# Patient Record
Sex: Male | Born: 1990 | Race: Black or African American | Hispanic: No | Marital: Single | Smoking: Never smoker
Health system: Southern US, Community
[De-identification: ages and names within clinical notes are randomized; demographics above are authoritative.]

## PROBLEM LIST (undated history)

## (undated) DIAGNOSIS — J302 Other seasonal allergic rhinitis: Secondary | ICD-10-CM

## (undated) HISTORY — PX: HERNIA REPAIR: SHX51

---

## 2018-08-12 ENCOUNTER — Emergency Department (HOSPITAL_COMMUNITY)
Admission: EM | Admit: 2018-08-12 | Discharge: 2018-08-12 | Disposition: A | Discharge status: Home / Self Care | Payer: Self-pay | Attending: Emergency Medicine | Admitting: Emergency Medicine

## 2018-08-12 ENCOUNTER — Emergency Department (HOSPITAL_COMMUNITY): Payer: Self-pay

## 2018-08-12 ENCOUNTER — Encounter (HOSPITAL_COMMUNITY): Payer: Self-pay

## 2018-08-12 ENCOUNTER — Other Ambulatory Visit: Payer: Self-pay

## 2018-08-12 DIAGNOSIS — R0789 Other chest pain: Secondary | ICD-10-CM | POA: Insufficient documentation

## 2018-08-12 DIAGNOSIS — R1012 Left upper quadrant pain: Secondary | ICD-10-CM | POA: Insufficient documentation

## 2018-08-12 HISTORY — DX: Other seasonal allergic rhinitis: J30.2

## 2018-08-12 LAB — COMPREHENSIVE METABOLIC PANEL
ALT: 40 U/L (ref 0–44)
AST: 31 U/L (ref 15–41)
Albumin: 4.6 g/dL (ref 3.5–5.0)
Alkaline Phosphatase: 51 U/L (ref 38–126)
Anion gap: 10 (ref 5–15)
BUN: 15 mg/dL (ref 6–20)
CO2: 29 mmol/L (ref 22–32)
Calcium: 9.5 mg/dL (ref 8.9–10.3)
Chloride: 100 mmol/L (ref 98–111)
Creatinine, Ser: 1.47 mg/dL — ABNORMAL HIGH (ref 0.61–1.24)
GFR calc Af Amer: >60 mL/min (ref >60)
GFR calc non Af Amer: >60 mL/min (ref >60)
Glucose, Bld: 101 mg/dL — ABNORMAL HIGH (ref 70–99)
Potassium: 4.4 mmol/L (ref 3.5–5.1)
Sodium: 139 mmol/L (ref 135–145)
Total Bilirubin: 0.6 mg/dL (ref 0.3–1.2)
Total Protein: 8.3 g/dL — ABNORMAL HIGH (ref 6.5–8.1)

## 2018-08-12 LAB — CBC
HCT: 46.4 % (ref 39.0–52.0)
Hemoglobin: 15 g/dL (ref 13.0–17.0)
MCH: 32.2 pg (ref 26.0–34.0)
MCHC: 32.3 g/dL (ref 30.0–36.0)
MCV: 99.6 fL (ref 80.0–100.0)
Platelets: 172 10*3/uL (ref 150–400)
RBC: 4.66 MIL/uL (ref 4.22–5.81)
RDW: 12.3 % (ref 11.5–15.5)
WBC: 7.1 10*3/uL (ref 4.0–10.5)
nRBC: 0 % (ref 0.0–0.2)

## 2018-08-12 LAB — TROPONIN I (HIGH SENSITIVITY): Troponin I (High Sensitivity): 3 ng/L (ref <18)

## 2018-08-12 LAB — LIPASE, BLOOD: Lipase: 26 U/L (ref 11–51)

## 2018-08-12 MED ORDER — LIDOCAINE VISCOUS HCL 2 % MT SOLN
15.0000 mL | Freq: Once | OROMUCOSAL | Status: AC
  Administered 2018-08-12: 10:00:00 15 mL via ORAL
  Filled 2018-08-12: qty 15

## 2018-08-12 MED ORDER — ALUM & MAG HYDROXIDE-SIMETH 200-200-20 MG/5ML PO SUSP
30.0000 mL | Freq: Once | ORAL | Status: AC
  Administered 2018-08-12: 30 mL via ORAL
  Filled 2018-08-12: qty 30

## 2018-08-12 MED ORDER — SODIUM CHLORIDE 0.9% FLUSH
3.0000 mL | Freq: Once | INTRAVENOUS | Status: AC
  Administered 2018-08-12: 3 mL via INTRAVENOUS

## 2018-08-12 MED ORDER — OMEPRAZOLE 20 MG PO CPDR: 20.0000 mg | DELAYED_RELEASE_CAPSULE | Freq: Every day | ORAL | 0 refills | Status: DC

## 2018-08-12 MED ORDER — SODIUM CHLORIDE 0.9 % IV BOLUS
1000.0000 mL | Freq: Once | INTRAVENOUS | Status: AC
  Administered 2018-08-12: 1000 mL via INTRAVENOUS

## 2018-08-12 MED ORDER — SUCRALFATE 1 G PO TABS: 1.0000 g | ORAL_TABLET | Freq: Three times a day (TID) | ORAL | 0 refills | Status: DC

## 2018-08-12 NOTE — ED Triage Notes (Signed)
Patient c/o mid upper chest pain and LUQ pain x 1 week. Patient denies any SOB or diaphoresis.

## 2018-08-12 NOTE — ED Notes (Signed)
Patient given discharge teaching and verbalized understanding. Patient ambulated out of ED with a steady gait. 

## 2018-08-12 NOTE — Discharge Instructions (Addendum)
Please follow the instructions about dietary changes.  Please make sure you are eating your food slowly.  I have given you the information for GI doctor incase your symptoms fail to improve in 1-2 weeks despite making these changes.   I have given you 2 prescriptions today.  The most important is Prilosec.  This is also available over-the-counter, please check dosages to make sure that it is the same.  If your insurance will pay for it you can attempt to get it is a prescription also however it normally still is less expensive over-the-counter.  I have also given you a prescription for Carafate.  This is a prescription only medication and can help coat your stomach to decrease irritation.    If your pain or symptoms change or become concerning to you please seek additional medical care and evaluation.  I have given you the information of a local primary care doctor who will see patients without insurance.  I would recommend you call them, or another doctor and make an appointment.

## 2018-08-12 NOTE — ED Provider Notes (Signed)
[facility]-EMERGENCY DEPT Provider Note   CSN: [id number] Arrival date & time: 08/12/18  [id number]    History   Chief Complaint Chief Complaint  Patient presents with  . Chest Pain  . Abdominal Pain    HPI Steven Weber is a 28 y.o. male with no significant past medical history presents today for evaluation of midline anterior chest pain and left upper quadrant abdominal pain for approximately 1 week.  He reports that his pain is intermittent, and he has noticed that it happens more frequently after eating.  He has not noticed any other aggravating or alleviating factors.  His pain was unchanged with Gas-X.  He does report that when the pain is present it typically last 3 to 4 hours and when he burps it helps slightly.  He describes his pain as feeling like a burp is trapped that he cannot get out.  He does note that he eats his food very fast and wonders if that may be contributing.  He denies any shortness of breath or sick contacts.  He denies any leg swelling, hemoptysis, hormone use, recent surgeries or immobilizations.  No history of PE/DVT.  He denies any known cardiac history.  He denies any recent trauma.  He does not smoke or use tobacco.  He states that in the past week he has had 1 bottle of wine however denies any other alcohol.  He reports that the wind did not change his abdominal pain.   No known coronavirus exposures.     HPI  Past Medical History:  Diagnosis Date  . Seasonal allergies     There are no active problems to display for this patient.   Past Surgical History:  Procedure Laterality Date  . HERNIA REPAIR     As a infant        Home Medications    Prior to Admission medications   Medication Sig Start Date End Date Taking? Authorizing Provider  Simethicone (GAS-X PO) Take 1 tablet by mouth as needed (gas).   Yes [provider]  omeprazole (PRILOSEC) 20 MG capsule Take 1 capsule (20 mg total) by mouth daily for 28  days. 08/12/18 09/09/18  Cristina GongHammond, Quashawn Jewkes W, PA-C  sucralfate (CARAFATE) 1 g tablet Take 1 tablet (1 g total) by mouth 4 (four) times daily -  with meals and at bedtime for 14 days. 08/12/18 08/26/18  Cristina GongHammond, Riyanshi Wahab W, PA-C    Family History Family History  Problem Relation Age of Onset  . Healthy Mother   . Healthy Father     Social History Social History   Tobacco Use  . Smoking status: Never Smoker  . Smokeless tobacco: Never Used  Substance Use Topics  . Alcohol use: Yes    Alcohol/week: 2.0 - 6.0 standard drinks    Types: 2 - 6 Cans of beer per week  . Drug use: Never     Allergies   Patient has no known allergies.   Review of Systems Review of Systems  Constitutional: Negative for chills, diaphoresis and fever.  Respiratory: Negative for chest tightness and shortness of breath.   Cardiovascular: Positive for chest pain. Negative for palpitations and leg swelling.  Gastrointestinal: Positive for abdominal pain. Negative for diarrhea, nausea and vomiting.  Musculoskeletal: Negative for back pain and neck pain.  Skin: Negative for color change and wound.  Neurological: Negative for weakness, numbness and headaches.  Psychiatric/Behavioral: Negative for confusion.  All other systems reviewed and are negative.  Physical Exam Updated Vital Signs BP 128/73   Pulse 77   Temp 98.8 F (37.1 C) (Oral)   Resp (!) 21   Ht 5\' 6"  (1.676 m)   Wt 110.7 kg   SpO2 100%   BMI 39.38 kg/m   Physical Exam Vitals signs and nursing note reviewed.  Constitutional:      General: He is not in acute distress.    Appearance: He is well-developed. He is not diaphoretic.  HENT:     Head: Normocephalic and atraumatic.  Eyes:     General: No scleral icterus.       Right eye: No discharge.        Left eye: No discharge.     Conjunctiva/sclera: Conjunctivae normal.  Neck:     Musculoskeletal: Normal range of motion and neck supple.  Cardiovascular:     Rate and Rhythm:  Normal rate and regular rhythm.     Heart sounds: Normal heart sounds.  Pulmonary:     Effort: Pulmonary effort is normal. No respiratory distress.     Breath sounds: Normal breath sounds. No stridor. No decreased breath sounds.  Chest:     Chest wall: No mass, deformity, tenderness or edema.  Abdominal:     General: Bowel sounds are normal. There is no distension.     Palpations: Abdomen is soft. There is no mass.     Tenderness: There is no abdominal tenderness. There is no guarding.  Musculoskeletal:        General: No deformity.     Right lower leg: He exhibits no tenderness. No edema.     Left lower leg: He exhibits no tenderness. No edema.  Skin:    General: Skin is warm and dry.  Neurological:     General: No focal deficit present.     Mental Status: He is alert.     Motor: No abnormal muscle tone.  Psychiatric:        Mood and Affect: Mood normal.        Behavior: Behavior normal.      ED Treatments / Results  Labs (all labs ordered are listed, but only abnormal results are displayed) Labs Reviewed  COMPREHENSIVE METABOLIC PANEL - Abnormal; Notable for the following components:      Result Value   Glucose, Bld 101 (*)    Creatinine, Ser 1.47 (*)    Total Protein 8.3 (*)    All other components within normal limits  CBC  LIPASE, BLOOD  TROPONIN I (HIGH SENSITIVITY)  TROPONIN I (HIGH SENSITIVITY)    EKG EKG Interpretation  Date/Time:  Sunday August 12 2018 07:57:08 EDT Ventricular Rate:  78 PR Interval:    QRS Duration: 72 QT Interval:  348 QTC Calculation: 397 R Axis:   42 Text Interpretation:  Sinus arrhythmia No old tracing to compare Confirmed by Dorie Rank [phone]) on 08/12/2018 8:00:01 AM   Radiology Dg Chest 2 View  Result Date: 08/12/2018 CLINICAL DATA:  28 year old male with history of chest pain and left upper quadrant abdominal pain for 1 week. EXAM: CHEST - 2 VIEW COMPARISON:  No priors. FINDINGS: Lung volumes are normal. No consolidative  airspace disease. No pleural effusions. No pneumothorax. No pulmonary nodule or mass noted. Pulmonary vasculature and the cardiomediastinal silhouette are within normal limits. IMPRESSION: No radiographic evidence of acute cardiopulmonary disease. Electronically Signed   By: Vinnie Langton M.D.   On: 08/12/2018 08:58    Procedures Procedures (including critical care time)  Medications Ordered  in ED Medications  sodium chloride flush (NS) 0.9 % injection 3 mL (3 mLs Intravenous Given 08/12/18 0832)  sodium chloride 0.9 % bolus 1,000 mL (0 mLs Intravenous Stopped 08/12/18 1038)  alum & mag hydroxide-simeth (MAALOX/MYLANTA) 200-200-20 MG/5ML suspension 30 mL (30 mLs Oral Given 08/12/18 0941)    And  lidocaine (XYLOCAINE) 2 % viscous mouth solution 15 mL (15 mLs Oral Given 08/12/18 0942)     Initial Impression / Assessment and Plan / ED Course  I have reviewed the triage vital signs and the nursing notes.  Pertinent labs & imaging results that were available during my care of the patient were reviewed by me and considered in my medical decision making (see chart for details).  Clinical Course as of Aug 12 1422  Sun Aug 12, 2018  1033 Patient reevaluated, he reports moderate relief with GI cocktail of Maalox and lidocaine.  Requesting discharge at this time.  Repeat abd exam benign.    [EH]    Clinical Course User Index [EH] Cristina GongHammond, Renne Cornick W, PA-C      Patient presents today for evaluation of midline anterior chest pain and left upper quadrant abdominal pain for approximately 1 week.  He is hemodynamically stable.  PERC negative.  Chest x-ray obtained without evidence of acute abnormalities.  EKG without evidence of ischemia.  His troponin is not elevated, his creatinine is very slightly high at 1.47 without old to compare however he does not have other significant hematologic or electrolyte derangements.  Lipase is not elevated.  I suspect that his creatinine is slightly high as he got  up this morning and came directly here and therefore has not had anything to drink since last night.  He is given 1 L IV fluids in the emergency room.  Recommended recheck with PCP in 1 to 2 weeks.  He was treated with GI cocktail after which he had moderate improvement in his symptoms.  Heart score is 0, do not suspect ACS.  Both initial and repeat abdominal exams remained benign without tenderness to palpation.  Suspect GERD versus esophagitis/gastritis.  Recommended Prilosec, and Carafate along with dietary changes.  He is given information for GI follow-up with instructions that if his symptoms fail to improve despite 2 weeks of treatment he should schedule an appointment for additional evaluation.  Return precautions were discussed with patient who states their understanding.  At the time of discharge patient denied any unaddressed complaints or concerns.  Patient is agreeable for discharge home.  Final Clinical Impressions(s) / ED Diagnoses   Final diagnoses:  Atypical chest pain  Left upper quadrant pain    ED Discharge Orders         Ordered    omeprazole (PRILOSEC) 20 MG capsule  Daily     08/12/18 1039    sucralfate (CARAFATE) 1 g tablet  3 times daily with meals & bedtime     08/12/18 1039           Cristina GongHammond, Shatima Zalar W, [place] JerseyPA-C 08/12/18 1424    Linwood DibblesKnapp, Jon, MD 08/13/18 1248

## 2018-08-12 NOTE — ED Notes (Signed)
Pt made aware of urinalysis. Patient stated they are unable to provide a sample at this time.

## 2019-10-15 DIAGNOSIS — R079 Chest pain, unspecified: Secondary | ICD-10-CM | POA: Insufficient documentation

## 2019-10-16 ENCOUNTER — Emergency Department (HOSPITAL_COMMUNITY): Payer: BC Managed Care – PPO

## 2019-10-16 ENCOUNTER — Emergency Department (HOSPITAL_COMMUNITY)
Admission: EM | Admit: 2019-10-16 | Discharge: 2019-10-16 | Disposition: A | Discharge status: Home / Self Care | Payer: BC Managed Care – PPO | Attending: Emergency Medicine | Admitting: Emergency Medicine

## 2019-10-16 ENCOUNTER — Other Ambulatory Visit: Payer: Self-pay

## 2019-10-16 ENCOUNTER — Encounter (HOSPITAL_COMMUNITY): Payer: Self-pay | Admitting: Emergency Medicine

## 2019-10-16 DIAGNOSIS — R079 Chest pain, unspecified: Secondary | ICD-10-CM

## 2019-10-16 LAB — BASIC METABOLIC PANEL
Anion gap: 10 (ref 5–15)
BUN: 13 mg/dL (ref 6–20)
CO2: 26 mmol/L (ref 22–32)
Calcium: 9.9 mg/dL (ref 8.9–10.3)
Chloride: 101 mmol/L (ref 98–111)
Creatinine, Ser: 1.33 mg/dL — ABNORMAL HIGH (ref 0.61–1.24)
GFR calc Af Amer: >60 mL/min (ref >60)
GFR calc non Af Amer: >60 mL/min (ref >60)
Glucose, Bld: 100 mg/dL — ABNORMAL HIGH (ref 70–99)
Potassium: 3.7 mmol/L (ref 3.5–5.1)
Sodium: 137 mmol/L (ref 135–145)

## 2019-10-16 LAB — CBC
HCT: 44.1 % (ref 39.0–52.0)
Hemoglobin: 14.7 g/dL (ref 13.0–17.0)
MCH: 31.3 pg (ref 26.0–34.0)
MCHC: 33.3 g/dL (ref 30.0–36.0)
MCV: 94 fL (ref 80.0–100.0)
Platelets: 185 10*3/uL (ref 150–400)
RBC: 4.69 MIL/uL (ref 4.22–5.81)
RDW: 12 % (ref 11.5–15.5)
WBC: 8.7 10*3/uL (ref 4.0–10.5)
nRBC: 0 % (ref 0.0–0.2)

## 2019-10-16 LAB — TROPONIN I (HIGH SENSITIVITY)
Troponin I (High Sensitivity): 3 ng/L (ref <18)
Troponin I (High Sensitivity): 2 ng/L (ref <18)

## 2019-10-16 MED ORDER — LIDOCAINE VISCOUS HCL 2 % MT SOLN
15.0000 mL | Freq: Once | OROMUCOSAL | Status: AC
  Administered 2019-10-16: 15 mL via ORAL
  Filled 2019-10-16: qty 15

## 2019-10-16 MED ORDER — ALUM & MAG HYDROXIDE-SIMETH 200-200-20 MG/5ML PO SUSP
30.0000 mL | Freq: Once | ORAL | Status: AC
  Administered 2019-10-16: 30 mL via ORAL
  Filled 2019-10-16: qty 30

## 2019-10-16 MED ORDER — SUCRALFATE 1 G PO TABS: 1.0000 g | ORAL_TABLET | Freq: Three times a day (TID) | ORAL | 0 refills | Status: AC

## 2019-10-16 MED ORDER — OMEPRAZOLE 20 MG PO CPDR: 20.0000 mg | DELAYED_RELEASE_CAPSULE | Freq: Every day | ORAL | 0 refills | Status: AC

## 2019-10-16 NOTE — ED Triage Notes (Signed)
Patient is complaining of right side chest pain that has been going on for several weeks. Patient states he is not in pain now but feels like heart burn.

## 2019-10-16 NOTE — ED Provider Notes (Signed)
[facility]-EMERGENCY DEPT Provider Note   CSN: [id number] Arrival date & time: 10/15/19  2334     History Chief Complaint  Patient presents with   Chest Pain    Steven Weber is a 29 y.o. male.  Patient presents to the emergency department with a chief complaint of chest pain.  He reports having had the symptoms for the past couple of weeks.  He denies any medical problems.  Denies any early family history of heart disease.  He does not smoke.  He denies any history of PE or DVT.  He states that the pain feels like heartburn.  He denies treatments prior to arrival.  Denies any other associated symptoms.  The history is provided by the patient. No language interpreter was used.       Past Medical History:  Diagnosis Date   Seasonal allergies     There are no problems to display for this patient.   Past Surgical History:  Procedure Laterality Date   HERNIA REPAIR     As a infant       Family History  Problem Relation Age of Onset   Healthy Mother    Healthy Father     Social History   Tobacco Use   Smoking status: Never Smoker   Smokeless tobacco: Never Used  Vaping Use   Vaping Use: Never used  Substance Use Topics   Alcohol use: Yes    Alcohol/week: 2.0 - 6.0 standard drinks    Types: 2 - 6 Cans of beer per week   Drug use: Never    Home Medications Prior to Admission medications   Medication Sig Start Date End Date Taking? Authorizing Provider  omeprazole (PRILOSEC) 20 MG capsule Take 1 capsule (20 mg total) by mouth daily. 10/16/19   Roxy Horseman, PA-C  Simethicone (GAS-X PO) Take 1 tablet by mouth as needed (gas).    [provider]  sucralfate (CARAFATE) 1 g tablet Take 1 tablet (1 g total) by mouth 4 (four) times daily -  with meals and at bedtime. 10/16/19   Roxy Horseman, PA-C    Allergies    Patient has no known allergies.  Review of Systems   Review of Systems  All other systems reviewed  and are negative.   Physical Exam Updated Vital Signs BP 109/70    Pulse 69    Temp 98.6 F (37 C) (Oral)    Resp (!) 22    Ht 5\' 6"  (1.676 m)    Wt 113.9 kg    SpO2 97%    BMI 40.51 kg/m   Physical Exam Vitals and nursing note reviewed.  Constitutional:      Appearance: He is well-developed.  HENT:     Head: Normocephalic and atraumatic.  Eyes:     Conjunctiva/sclera: Conjunctivae normal.  Cardiovascular:     Rate and Rhythm: Normal rate and regular rhythm.     Heart sounds: No murmur heard.   Pulmonary:     Effort: Pulmonary effort is normal. No respiratory distress.     Breath sounds: Normal breath sounds.  Abdominal:     Palpations: Abdomen is soft.     Tenderness: There is no abdominal tenderness.  Musculoskeletal:     Cervical back: Neck supple.  Skin:    General: Skin is warm and dry.  Neurological:     Mental Status: He is alert and oriented to person, place, and time.  Psychiatric:  Mood and Affect: Mood normal.        Behavior: Behavior normal.     ED Results / Procedures / Treatments   Labs (all labs ordered are listed, but only abnormal results are displayed) Labs Reviewed  BASIC METABOLIC PANEL - Abnormal; Notable for the following components:      Result Value   Glucose, Bld 100 (*)    Creatinine, Ser 1.33 (*)    All other components within normal limits  CBC  TROPONIN I (HIGH SENSITIVITY)  TROPONIN I (HIGH SENSITIVITY)    EKG None  Radiology DG Chest 2 View  Result Date: 10/16/2019 CLINICAL DATA:  Mid chest pain for 2 weeks EXAM: CHEST - 2 VIEW COMPARISON:  Radiograph 08/12/2018 FINDINGS: Hazy and streaky opacities towards the lung bases with shallow inspiration likely reflect atelectatic changes. Stable biapical pleuroparenchymal thickening. No consolidation, features of edema, pneumothorax, or effusion. The cardiomediastinal contours are unremarkable. No acute osseous or soft tissue abnormality. IMPRESSION: Basilar atelectatic changes,  no other acute cardiopulmonary abnormality. Electronically Signed   By: Kreg Shropshire M.D.   On: 10/16/2019 00:23    Procedures Procedures (including critical care time)  Medications Ordered in ED Medications  alum & mag hydroxide-simeth (MAALOX/MYLANTA) 200-200-20 MG/5ML suspension 30 mL (has no administration in time range)    And  lidocaine (XYLOCAINE) 2 % viscous mouth solution 15 mL (has no administration in time range)    ED Course  I have reviewed the triage vital signs and the nursing notes.  Pertinent labs & imaging results that were available during my care of the patient were reviewed by me and considered in my medical decision making (see chart for details).    MDM Rules/Calculators/A&P                          Patient with chest pain that has been ongoing for the past couple weeks.  He is afebrile.  He is not tachycardic nor hypoxic, doubt PE.  He is PERC negative.  Chest x-ray shows no obvious abnormality.  He is low risk for ACS based on lack of risk factors and age.  His symptoms feel like heartburn, I suspect this is the source, and will prescribe Carafate and omeprazole.  Patient understands and agrees with the plan.  He will follow up with his regular doctor or return for new or worsening symptoms. Final Clinical Impression(s) / ED Diagnoses Final diagnoses:  Nonspecific chest pain    Rx / DC Orders ED Discharge Orders         Ordered    sucralfate (CARAFATE) 1 g tablet  3 times daily with meals & bedtime        10/16/19 0527    omeprazole (PRILOSEC) 20 MG capsule  Daily        10/16/19 0527           Roxy Horseman, PA-C 10/16/19 0530    Dione Booze, MD 10/16/19 [phone]

## 2019-10-16 NOTE — ED Notes (Signed)
Save blue in main lab 

## 2022-04-03 IMAGING — CR DG CHEST 2V
2 series · 2 of 2 positions shown · non-contrast
Comparison: Radiograph 08/12/2018

CLINICAL DATA: Mid chest pain for 2 weeks

EXAM:
CHEST - 2 VIEW

[w chest pa]
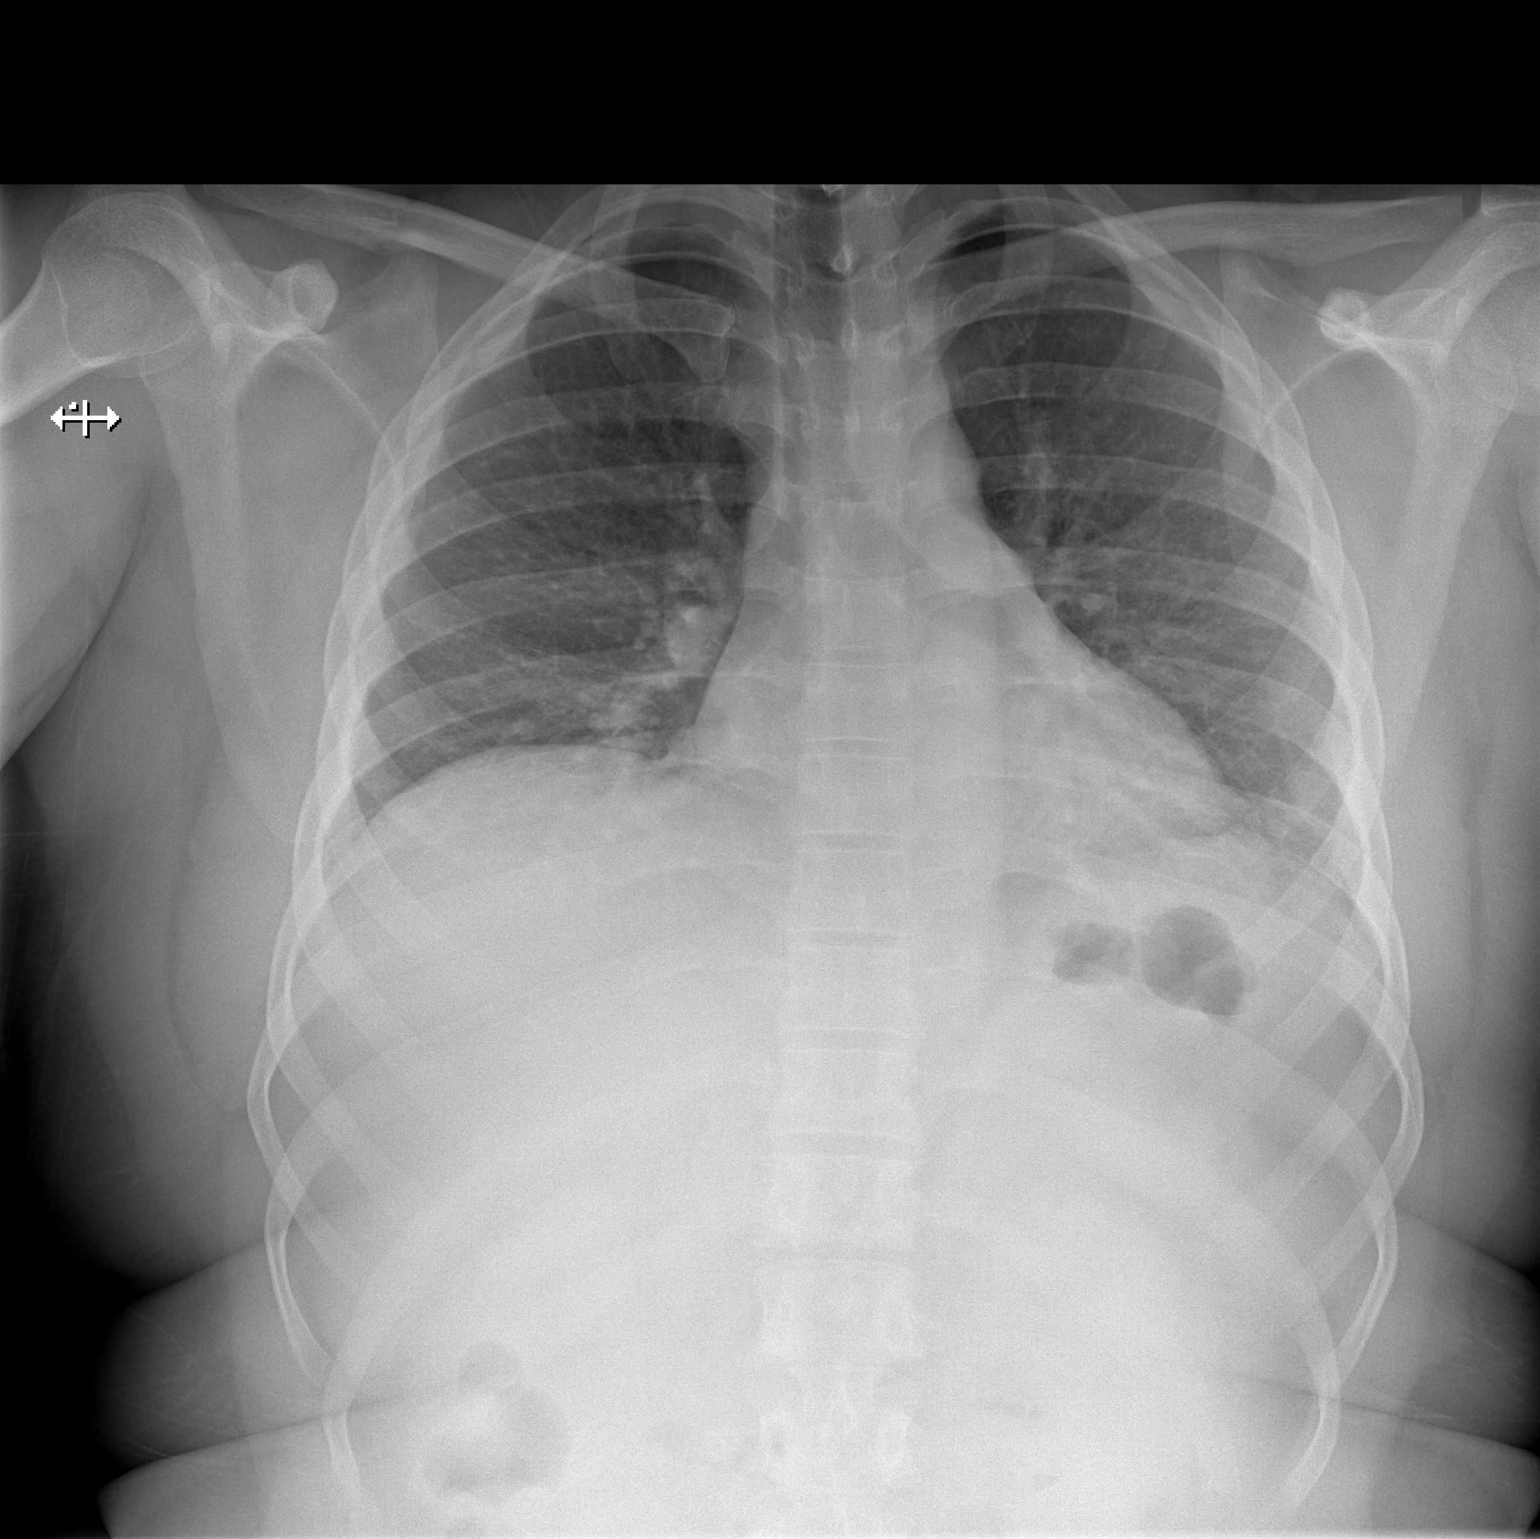

[w chest lat]
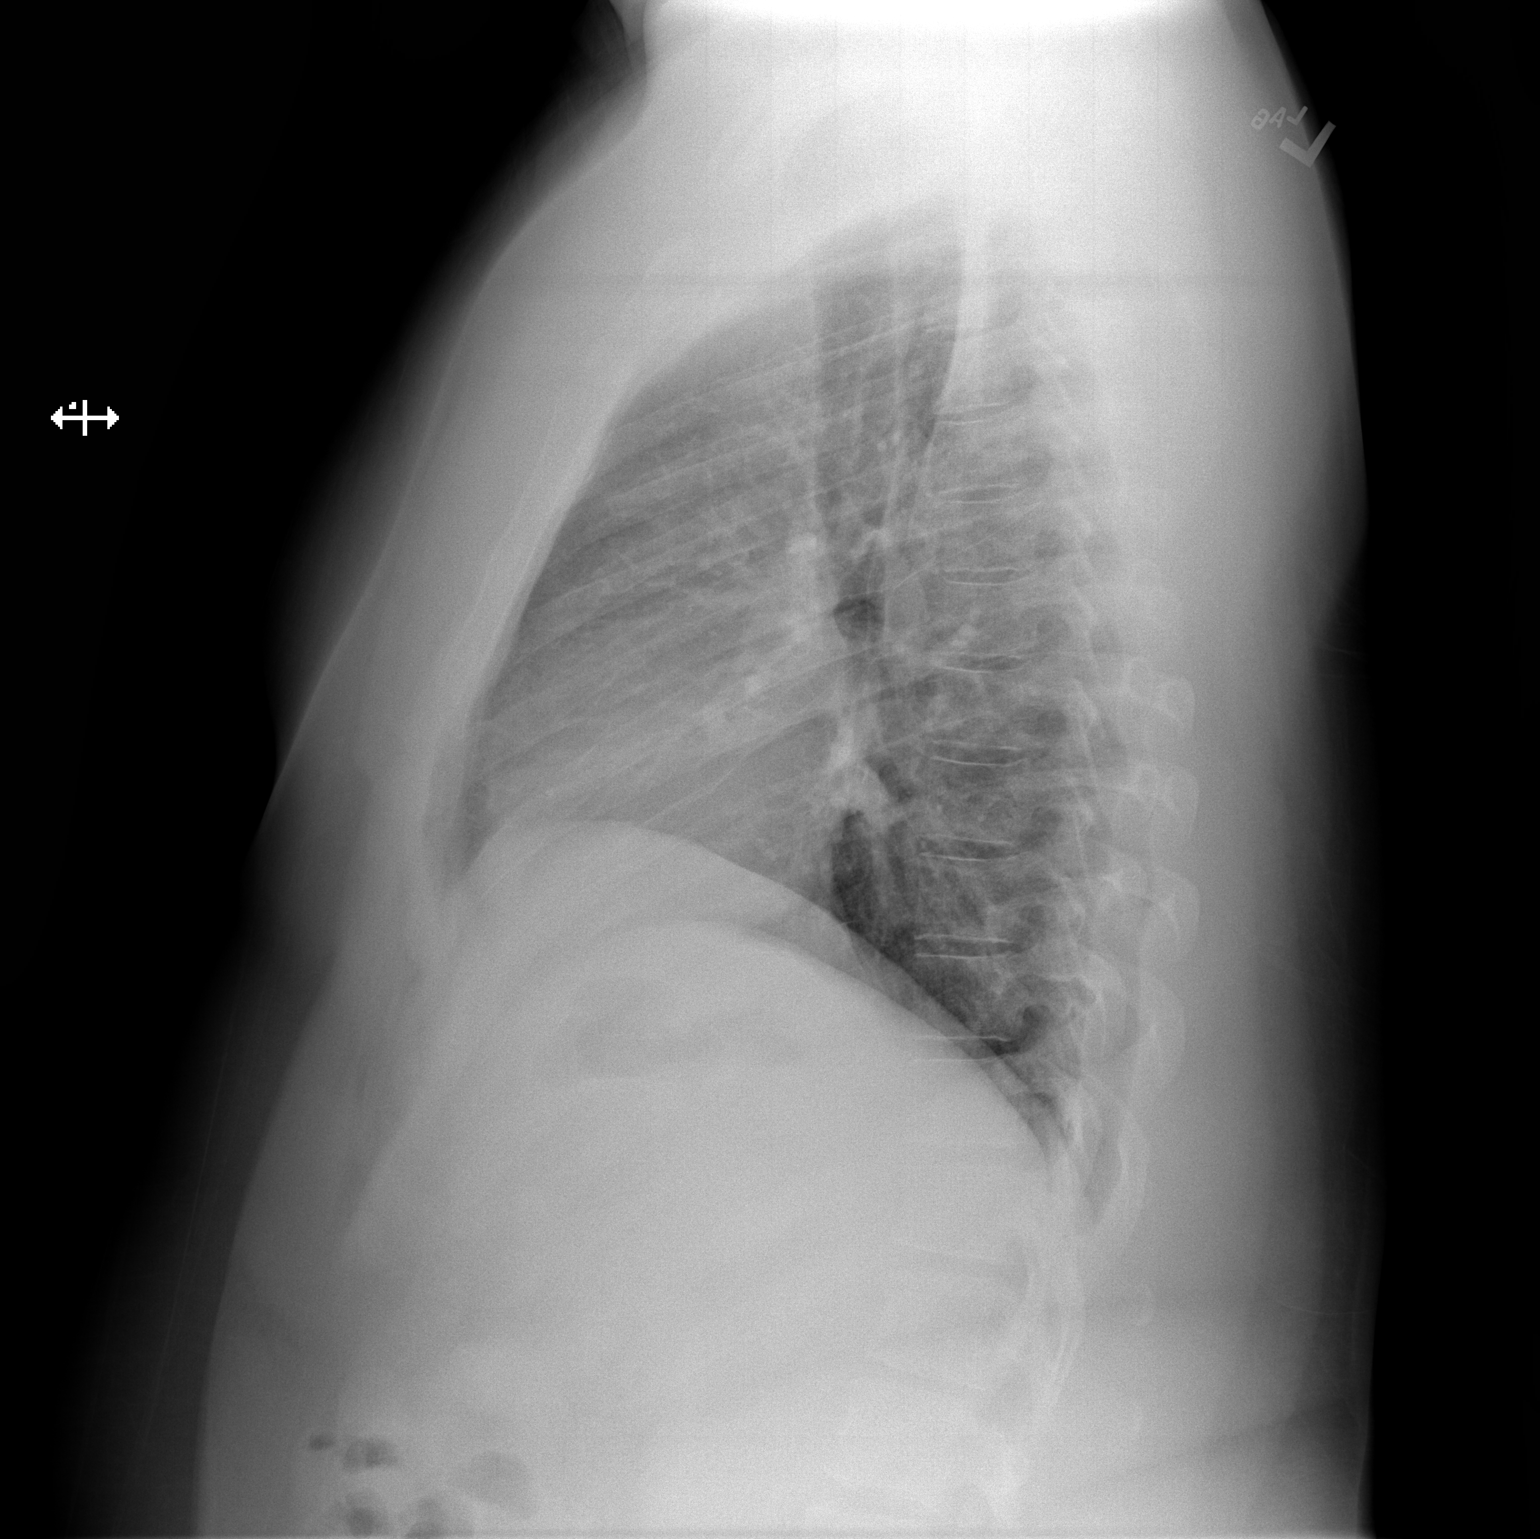

[2 of 2 positions shown; findings below may reference images not displayed]

FINDINGS: Hazy and streaky opacities towards the lung bases with shallow
inspiration likely reflect atelectatic changes. Stable biapical
pleuroparenchymal thickening. No consolidation, features of edema,
pneumothorax, or effusion. The cardiomediastinal contours are
unremarkable. No acute osseous or soft tissue abnormality.
IMPRESSION: Basilar atelectatic changes, no other acute cardiopulmonary
abnormality.

## 2022-07-30 ENCOUNTER — Ambulatory Visit
Admission: RE | Admit: 2022-07-30 | Discharge: 2022-07-30 | Disposition: A | Discharge status: Home / Self Care | Payer: BC Managed Care – PPO | Source: Ambulatory Visit | Attending: Internal Medicine | Admitting: Internal Medicine

## 2022-07-30 VITALS — BP 105/67 | HR 77 | Temp 98.1°F | Resp 16

## 2022-07-30 DIAGNOSIS — Z9109 Other allergy status, other than to drugs and biological substances: Secondary | ICD-10-CM | POA: Diagnosis not present

## 2022-07-30 MED ORDER — CETIRIZINE HCL 10 MG PO TABS: 10.0000 mg | ORAL_TABLET | Freq: Every day | ORAL | 0 refills | Status: AC

## 2022-07-30 MED ORDER — FLUTICASONE PROPIONATE 50 MCG/ACT NA SUSP: 1.0000 | Freq: Every day | NASAL | 0 refills | Status: AC

## 2022-07-30 NOTE — Discharge Instructions (Signed)
Start Flonase and Zyrtec daily.  Lots of rest and fluids.  Please follow-up with your PCP if your symptoms do not improve.  Please go to the emergency room for any worsening symptoms.  Hope you feel better soon!

## 2022-07-30 NOTE — ED Provider Notes (Signed)
[facility]    CSN: [id number] Arrival date & time: 07/30/22  1914      History   Chief Complaint Chief Complaint  Patient presents with   Sore Throat    Respiratory inflammation - Entered by patient    HPI Steven Weber is a 32 y.o. male  presents for evaluation of URI symptoms for 1 days. Patient reports associated symptoms of an intermittent congestion with cough and sometimes throat irritation. Denies N/V/D, sore throat, fevers, chills, ear pain, body aches, shortness of breath. Patient does not have a hx of asthma or smoking.  Pt has taken nothing OTC for symptoms. Pt has no other concerns at this time.    Sore Throat    Past Medical History:  Diagnosis Date   Seasonal allergies     There are no problems to display for this patient.   Past Surgical History:  Procedure Laterality Date   HERNIA REPAIR     As a infant       Home Medications    Prior to Admission medications   Medication Sig Start Date End Date Taking? Authorizing Provider  atorvastatin (LIPITOR) 40 MG tablet Take 40 mg by mouth daily.   Yes [provider]  cetirizine (ZYRTEC ALLERGY) 10 MG tablet Take 1 tablet (10 mg total) by mouth daily for 14 days. 07/30/22 08/13/22 Yes Radford Pax, NP  cholecalciferol (VITAMIN D3) 25 MCG (1000 UNIT) tablet Take 1,000 Units by mouth daily.   Yes [provider]  fluticasone (FLONASE) 50 MCG/ACT nasal spray Place 1 spray into both nostrils daily. 07/30/22  Yes Radford Pax, NP  omeprazole (PRILOSEC) 20 MG capsule Take 1 capsule (20 mg total) by mouth daily. 10/16/19   Roxy Horseman, PA-C  Simethicone (GAS-X PO) Take 1 tablet by mouth as needed (gas).    [provider]  sucralfate (CARAFATE) 1 g tablet Take 1 tablet (1 g total) by mouth 4 (four) times daily -  with meals and at bedtime. 10/16/19   Roxy Horseman, PA-C    Family History Family History  Problem Relation Age of Onset   Healthy Mother    Healthy  Father     Social History Social History   Tobacco Use   Smoking status: Never   Smokeless tobacco: Never  Vaping Use   Vaping status: Never Used  Substance Use Topics   Alcohol use: Yes    Alcohol/week: 2.0 - 6.0 standard drinks of alcohol    Types: 2 - 6 Cans of beer per week    Comment: once a week   Drug use: Never     Allergies   Patient has no known allergies.   Review of Systems Review of Systems  HENT:  Positive for congestion.        Throat irritation     Physical Exam Triage Vital Signs ED Triage Vitals  Encounter Vitals Group     BP 07/30/22 0912 105/67     Systolic BP Percentile --      Diastolic BP Percentile --      Pulse Rate 07/30/22 0912 77     Resp 07/30/22 0912 16     Temp 07/30/22 0912 98.1 F (36.7 C)     Temp Source 07/30/22 0912 Oral     SpO2 07/30/22 0912 94 %     Weight --      Height --      Head Circumference --      Peak  Flow --      Pain Score 07/30/22 0914 0     Pain Loc --      Pain Education --      Exclude from Growth Chart --    No data found.  Updated Vital Signs BP 105/67 (BP Location: Right Arm)   Pulse 77   Temp 98.1 F (36.7 C) (Oral)   Resp 16   SpO2 94%   Visual Acuity Right Eye Distance:   Left Eye Distance:   Bilateral Distance:    Right Eye Near:   Left Eye Near:    Bilateral Near:     Physical Exam Vitals and nursing note reviewed.  Constitutional:      General: He is not in acute distress.    Appearance: Normal appearance. He is not ill-appearing or toxic-appearing.  HENT:     Head: Normocephalic and atraumatic.     Right Ear: Tympanic membrane and ear canal normal.     Left Ear: Tympanic membrane and ear canal normal.     Nose: Rhinorrhea present. No congestion.     Mouth/Throat:     Mouth: Mucous membranes are moist.     Pharynx: No posterior oropharyngeal erythema.  Eyes:     Pupils: Pupils are equal, round, and reactive to light.  Cardiovascular:     Rate and Rhythm: Normal rate  and regular rhythm.     Heart sounds: Normal heart sounds.  Pulmonary:     Effort: Pulmonary effort is normal.     Breath sounds: Normal breath sounds.  Musculoskeletal:     Cervical back: Normal range of motion and neck supple.  Lymphadenopathy:     Cervical: No cervical adenopathy.  Skin:    General: Skin is warm and dry.  Neurological:     General: No focal deficit present.     Mental Status: He is alert and oriented to person, place, and time.  Psychiatric:        Mood and Affect: Mood normal.        Behavior: Behavior normal.      UC Treatments / Results  Labs (all labs ordered are listed, but only abnormal results are displayed) Labs Reviewed - No data to display  EKG   Radiology No results found.  Procedures Procedures (including critical care time)  Medications Ordered in UC Medications - No data to display  Initial Impression / Assessment and Plan / UC Course  I have reviewed the triage vital signs and the nursing notes.  Pertinent labs & imaging results that were available during my care of the patient were reviewed by me and considered in my medical decision making (see chart for details).     Reviewed exam and symptoms with patient.  No red flags.  Discussed likely allergies as cause of symptoms.  Will do trial of Flonase and Zyrtec daily.  Advised PCP follow-up if symptoms do not improve.  ER precautions reviewed and patient verbalized understanding. Final Clinical Impressions(s) / UC Diagnoses   Final diagnoses:  Environmental allergies     Discharge Instructions      Start Flonase and Zyrtec daily.  Lots of rest and fluids.  Please follow-up with your PCP if your symptoms do not improve.  Please go to the emergency room for any worsening symptoms.  Hope you feel better soon!    ED Prescriptions     Medication Sig Dispense Auth. Provider   fluticasone (FLONASE) 50 MCG/ACT nasal spray Place 1 spray into both  nostrils daily. 15.8 mL Radford Pax, NP   cetirizine (ZYRTEC ALLERGY) 10 MG tablet Take 1 tablet (10 mg total) by mouth daily for 14 days. 14 tablet Radford Pax, NP      PDMP not reviewed this encounter.   Radford Pax, NP 07/30/22 [phone]

## 2022-07-30 NOTE — ED Triage Notes (Signed)
Pt presents to UC w/ c/o sore throat and cough starting yesterday. Home remedies: allergy medicine
